# Patient Record
Sex: Female | Born: 1970 | Race: White | Hispanic: No | Marital: Single | Smoking: Never smoker
Health system: Southern US, Community
[De-identification: ages and names within clinical notes are randomized; demographics above are authoritative.]

## PROBLEM LIST (undated history)

## (undated) HISTORY — PX: BRAIN SURGERY: SHX531

---

## 2017-05-05 DIAGNOSIS — S069X9A Unspecified intracranial injury with loss of consciousness of unspecified duration, initial encounter: Secondary | ICD-10-CM

## 2017-05-05 DIAGNOSIS — S069XAA Unspecified intracranial injury with loss of consciousness status unknown, initial encounter: Secondary | ICD-10-CM

## 2017-05-05 HISTORY — DX: Unspecified intracranial injury with loss of consciousness of unspecified duration, initial encounter: S06.9X9A

## 2017-05-05 HISTORY — DX: Unspecified intracranial injury with loss of consciousness status unknown, initial encounter: S06.9XAA

## 2019-07-20 ENCOUNTER — Emergency Department
Admission: EM | Admit: 2019-07-20 | Discharge: 2019-07-20 | Disposition: A | Discharge status: Home / Self Care | Payer: Self-pay | Source: Home / Self Care | Attending: Family Medicine | Admitting: Family Medicine

## 2019-07-20 ENCOUNTER — Other Ambulatory Visit: Payer: Self-pay

## 2019-07-20 DIAGNOSIS — R1032 Left lower quadrant pain: Secondary | ICD-10-CM | POA: Diagnosis not present

## 2019-07-20 DIAGNOSIS — Z8542 Personal history of malignant neoplasm of other parts of uterus: Secondary | ICD-10-CM

## 2019-07-20 DIAGNOSIS — Q6102 Congenital multiple renal cysts: Secondary | ICD-10-CM

## 2019-07-20 DIAGNOSIS — R109 Unspecified abdominal pain: Secondary | ICD-10-CM

## 2019-07-20 LAB — POCT URINALYSIS DIP (MANUAL ENTRY)
Bilirubin, UA: NEGATIVE
Blood, UA: NEGATIVE
Glucose, UA: NEGATIVE mg/dL
Ketones, POC UA: NEGATIVE mg/dL
Leukocytes, UA: NEGATIVE
Nitrite, UA: NEGATIVE
Spec Grav, UA: 1.015 (ref 1.010–1.025)
Urobilinogen, UA: 1 E.U./dL
pH, UA: 8.5 — AB (ref 5.0–8.0)

## 2019-07-20 MED ORDER — TRAMADOL HCL 50 MG PO TABS: 50.0000 mg | ORAL_TABLET | Freq: Four times a day (QID) | ORAL | 0 refills | Status: AC | PRN

## 2019-07-20 NOTE — ED Notes (Signed)
Abdomen Pelvis CT scan scheduled for tomorrow, 07/21/2019 at 10:00 a.m. Patient will pick up the required oral contrast today prior to leaving the facility. Patient verbalizes understanding.

## 2019-07-20 NOTE — ED Triage Notes (Signed)
Patient presents to Urgent Care with complaints of right flank pain and right groin pain since yesterday. Patient reports when she urinates, she has a pain in her "kidney" as well, pt states she has multiple cysts in her kidneys and is not sure if possibly one of them ruptured or of she has a kidney stone.

## 2019-07-20 NOTE — ED Provider Notes (Signed)
[facility]    CSN: [id number] Arrival date & time: 07/20/19  0957      History   Chief Complaint Chief Complaint  Patient presents with  . Flank Pain    HPI Carla Wolf is a 49 y.o. female.   Last night while sitting in a chair, patient arose and felt sudden sharp stabbing pain in her left flank that lasted at least a minute.  She continued to have a low level of pain in her left flank radiating to her left lower abdomen and groin.  She developed distinct pain in her left groin area during urination without dysuria, urgency, frequency, or hematuria.  Her pain is worse when walking and standing erect.  She denies fevers, chills, and sweats.  Last night she was awakened twice by recurrent pain, and she had one episode of nausea/vomiting.  She also notes having had chills last night during episodes of pain, but no fever. She has a past surgical history of endometrial cancer and hysterectomy (her right ovary remains in place) age 73.  She has a history of cystic kidneys.  She reports that she had a ruptured kidney cyst in the past.  She has also had an appendectomy in the past.  She denies history of kidney stones  The history is provided by the patient.  Flank Pain This is a new problem. The current episode started yesterday. The problem occurs constantly. The problem has been gradually improving. Associated symptoms include abdominal pain. The symptoms are aggravated by walking. Nothing relieves the symptoms. Treatments tried: Ibuprofen. The treatment provided no relief.    Past Medical History:  Diagnosis Date  . TBI (traumatic brain injury) (HCC) 2019    Active problems: Trigeminal neuralgia of right side of face 05/11/2018  History of concussion 03/24/2018  Adjustment disorder with mixed anxiety and depressed mood 03/24/2018  Cognitive change 03/24/2018  Chronic right shoulder pain 11/20/2014  H/O anaphylactic shock 08/03/2013  Attention deficit disorder       Surgical History HYSTERECTOMY 05/05/2002 - 05/05/2003     KNEE ARTHROSCOPY 05/05/1998 - 05/05/1999 Left    MANDIBLE FRACTURE SURGERY 05/06/1999 - 05/04/2000     NEUROFIBROMA EXCISION 05/05/2000 - 05/04/2001 Right right foot   TONSILLECTOMY 05/05/1998 - 05/05/1999     CHOLECYSTECTOMY 05/06/1999 - 05/04/2000     APPENDECTOMY 05/06/1987 - 05/04/1988     CRANIOTOMY MVD (MICRO VASCULAR DECOMPRESSION) 05/26/2018 Head/Right Procedure: CRANIOTOMY MVD (MICRO VASCULAR DECOMPRESSION); Surgeon: Whitney Post, MD; Location: [facility] MAIN OR; Service: Neurosurgery; Laterality: Right;  Medical devices from this surgery are in the Implants section.      Obstetrics History Grav Para Term Pre Abrt (TAB) (SAB) (Ect) Mult Lvng Comments  4 4 4               Home Medications    Prior to Admission medications   Medication Sig Start Date End Date Taking? Authorizing Provider  gabapentin (NEURONTIN) 100 MG capsule Take 100 mg by mouth 2 (two) times daily.   Yes [provider]  methylphenidate 54 MG PO CR tablet Take 54 mg by mouth every morning.   Yes [provider]  carbamazepine (TEGRETOL XR) 100 MG 12 hr tablet Take 100 mg by mouth 2 (two) times daily. 06/09/19   [provider]  donepezil (ARICEPT) 5 MG tablet Take 5 mg by mouth daily. 07/08/19   [provider]  pantoprazole (PROTONIX) 40 MG tablet Take 40 mg by mouth daily. 06/09/19   [provider]  rizatriptan (MAXALT) 10 MG tablet  03/24/19   [provider]  traMADol (ULTRAM) 50 MG tablet Take 1 tablet (50 mg total) by mouth every 6 (six) hours as needed for moderate pain or severe pain. 07/20/19   Lattie Haw, MD    Family History Family History  Adopted: Yes  Problem Relation Age of Onset  . Heart attack Mother   . Drug abuse Mother   . COPD Father   . Hypertension Father   . Heart failure Father     Social History Social History   Tobacco Use  . Smoking  status: Never Smoker  . Smokeless tobacco: Never Used  Substance Use Topics  . Alcohol use: Not Currently  . Drug use: Not on file     Allergies   Contrast media  [iodinated diagnostic agents], Iodides, Other, Sumatriptan, Bacitracin, Codeine, Polyethylene glycol, Prochlorperazine, Promethazine, Sulfa antibiotics, and Adhesive  [tape]   Review of Systems Review of Systems  Constitutional: Positive for activity change and chills. Negative for appetite change, diaphoresis, fatigue and fever.  HENT: Negative.   Eyes: Negative.   Respiratory: Negative.   Cardiovascular: Negative.   Gastrointestinal: Positive for abdominal pain, nausea and vomiting. Negative for blood in stool, constipation and diarrhea.  Genitourinary: Positive for flank pain. Negative for decreased urine volume, difficulty urinating, dysuria, frequency, hematuria, pelvic pain, urgency, vaginal bleeding, vaginal discharge and vaginal pain.  Skin: Negative.   Neurological: Negative.      Physical Exam Triage Vital Signs ED Triage Vitals  Enc Vitals Group     BP 07/20/19 1017 134/82     Pulse Rate 07/20/19 1017 69     Resp 07/20/19 1017 16     Temp 07/20/19 1017 98.1 F (36.7 C)     Temp Source 07/20/19 1017 Oral     SpO2 07/20/19 1017 97 %     Weight --      Height --      Head Circumference --      Peak Flow --      Pain Score 07/20/19 1011 8     Pain Loc --      Pain Edu? --      Excl. in GC? --    No data found.  Updated Vital Signs BP 134/82 (BP Location: Right Arm)   Pulse 69   Temp 98.1 F (36.7 C) (Oral)   Resp 16   SpO2 97%   Visual Acuity Right Eye Distance:   Left Eye Distance:   Bilateral Distance:    Right Eye Near:   Left Eye Near:    Bilateral Near:     Physical Exam Constitutional:      General: She is not in acute distress.    Appearance: She is obese.  HENT:     Head: Atraumatic.     Right Ear: External ear normal.     Left Ear: External ear normal.     Nose: Nose  normal.     Mouth/Throat:     Mouth: Mucous membranes are moist.  Eyes:     Conjunctiva/sclera: Conjunctivae normal.     Pupils: Pupils are equal, round, and reactive to light.  Cardiovascular:     Rate and Rhythm: Regular rhythm.     Heart sounds: Normal heart sounds.  Pulmonary:     Breath sounds: Normal breath sounds.  Abdominal:     General: Abdomen is protuberant. Bowel sounds are normal. There is no distension.     Palpations:  Abdomen is soft. There is no hepatomegaly, splenomegaly or mass.     Tenderness: There is abdominal tenderness in the left lower quadrant. There is left CVA tenderness. There is no right CVA tenderness or guarding.    Musculoskeletal:     Right lower leg: No edema.     Left lower leg: No edema.  Lymphadenopathy:     Cervical: No cervical adenopathy.  Skin:    General: Skin is warm and dry.     Findings: No rash.  Neurological:     Mental Status: She is alert.      UC Treatments / Results  Labs (all labs ordered are listed, but only abnormal results are displayed) Labs Reviewed  BASIC METABOLIC PANEL - Abnormal; Notable for the following components:      Result Value   Glucose, Bld 102 (*)    All other components within normal limits  POCT URINALYSIS DIP (MANUAL ENTRY) - Abnormal; Notable for the following components:   pH, UA 8.5 (*)    Protein Ur, POC trace (*)    All other components within normal limits    EKG   Radiology No results found.  Procedures Procedures (including critical care time)  Medications Ordered in UC Medications - No data to display  Initial Impression / Assessment and Plan / UC Course  I have reviewed the triage vital signs and the nursing notes.  Pertinent labs & imaging results that were available during my care of the patient were reviewed by me and considered in my medical decision making (see chart for details).    Flank and abdominal pain ?etiology.  Note unremarkable urinalysis.   Note  significant past history of endometrial CA and cystic kidneys.  Will schedule CT scan abdomen w/oral contrast tomorrow (note history of allergy to iodinated contrast agents). Rx for tramadol (#10, no refill); patient notes that she has had no adverse effects in the past.   Final Clinical Impressions(s) / UC Diagnoses   Final diagnoses:  Left lower quadrant abdominal pain  Left flank pain  Multiple renal cysts  History of endometrial cancer     Discharge Instructions     If symptoms become significantly worse during the night or over the weekend, proceed to the local emergency room.     ED Prescriptions    Medication Sig Dispense Auth. Provider   traMADol (ULTRAM) 50 MG tablet Take 1 tablet (50 mg total) by mouth every 6 (six) hours as needed for moderate pain or severe pain. 10 tablet Kandra Nicolas, MD        Kandra Nicolas, MD 07/21/19 [phone]

## 2019-07-20 NOTE — Discharge Instructions (Signed)
If symptoms become significantly worse during the night or over the weekend, proceed to the local emergency room.  

## 2019-07-21 ENCOUNTER — Other Ambulatory Visit: Payer: BC Managed Care – PPO

## 2019-07-21 ENCOUNTER — Emergency Department (INDEPENDENT_AMBULATORY_CARE_PROVIDER_SITE_OTHER)
Admission: EM | Admit: 2019-07-21 | Discharge: 2019-07-21 | Disposition: A | Discharge status: Home / Self Care | Payer: BC Managed Care – PPO | Source: Home / Self Care

## 2019-07-21 ENCOUNTER — Emergency Department (INDEPENDENT_AMBULATORY_CARE_PROVIDER_SITE_OTHER): Payer: BC Managed Care – PPO

## 2019-07-21 DIAGNOSIS — K5732 Diverticulitis of large intestine without perforation or abscess without bleeding: Secondary | ICD-10-CM

## 2019-07-21 DIAGNOSIS — N2889 Other specified disorders of kidney and ureter: Secondary | ICD-10-CM | POA: Diagnosis not present

## 2019-07-21 DIAGNOSIS — N281 Cyst of kidney, acquired: Secondary | ICD-10-CM

## 2019-07-21 LAB — BASIC METABOLIC PANEL
BUN: 9 mg/dL (ref 7–25)
CO2: 28 mmol/L (ref 20–32)
Calcium: 9.3 mg/dL (ref 8.6–10.4)
Chloride: 103 mmol/L (ref 98–110)
Creat: 0.85 mg/dL (ref 0.50–1.05)
Glucose, Bld: 102 mg/dL — ABNORMAL HIGH (ref 65–99)
Potassium: 4.1 mmol/L (ref 3.5–5.3)
Sodium: 140 mmol/L (ref 135–146)

## 2019-07-21 MED ORDER — AMOXICILLIN-POT CLAVULANATE 875-125 MG PO TABS: ORAL_TABLET | ORAL | 0 refills | Status: AC

## 2019-07-21 NOTE — ED Triage Notes (Addendum)
Patient placed in UC exam room to wait for CT results and to speak with the physician.  Patient was seen yesterday, should not have been arrived to receive CT scan results. No charge should be filed.

## 2019-07-21 NOTE — Discharge Instructions (Addendum)
Begin clear liquids for about 24 hours, then may begin a BRAT diet (Bananas, Rice, Applesauce, Toast) when abdominal pain improved. Then gradually advance to a regular diet as tolerated.   If symptoms become significantly worse during the night or over the weekend, proceed to the local emergency room.  

## 2019-07-21 NOTE — ED Provider Notes (Signed)
[facility]    CSN: [id number] Arrival date & time: 07/21/19  0948      History   Chief Complaint Chief Complaint  Patient presents with  . CT Results    HPI Carla Wolf is a 49 y.o. female.   Patient returns to discuss her CT abdomen/pelvis results.  She states that she still has left abdominal pain, not improved.  The history is provided by the patient.    Past Medical History:  Diagnosis Date  . TBI (traumatic brain injury) (HCC) 2019    There are no problems to display for this patient.   Past Surgical History:  Procedure Laterality Date  . BRAIN SURGERY      OB History   No obstetric history on file.      Home Medications    Prior to Admission medications   Medication Sig Start Date End Date Taking? Authorizing Provider  amoxicillin-clavulanate (AUGMENTIN) 875-125 MG tablet Take one tab PO Q8hr. Take with food 07/21/19   Lattie Haw, MD  carbamazepine (TEGRETOL XR) 100 MG 12 hr tablet Take 100 mg by mouth 2 (two) times daily. 06/09/19   [provider]  donepezil (ARICEPT) 5 MG tablet Take 5 mg by mouth daily. 07/08/19   [provider]  gabapentin (NEURONTIN) 100 MG capsule Take 100 mg by mouth 2 (two) times daily.    [provider]  methylphenidate 54 MG PO CR tablet Take 54 mg by mouth every morning.    [provider]  pantoprazole (PROTONIX) 40 MG tablet Take 40 mg by mouth daily. 06/09/19   [provider]  rizatriptan (MAXALT) 10 MG tablet  03/24/19   [provider]  traMADol (ULTRAM) 50 MG tablet Take 1 tablet (50 mg total) by mouth every 6 (six) hours as needed for moderate pain or severe pain. 07/20/19   Lattie Haw, MD    Family History Family History  Adopted: Yes  Problem Relation Age of Onset  . Heart attack Mother   . Drug abuse Mother   . COPD Father   . Hypertension Father   . Heart failure Father     Social History Social History   Tobacco Use  .  Smoking status: Never Smoker  . Smokeless tobacco: Never Used  Substance Use Topics  . Alcohol use: Not Currently  . Drug use: Not on file     Allergies   Contrast media  [iodinated diagnostic agents], Iodides, Other, Sumatriptan, Bacitracin, Codeine, Polyethylene glycol, Prochlorperazine, Promethazine, Sulfa antibiotics, and Adhesive  [tape]   Review of Systems Review of Systems  Constitutional: Positive for activity change and fatigue. Negative for appetite change, chills, diaphoresis and fever.  Gastrointestinal: Positive for abdominal pain. Negative for abdominal distention.     Physical Exam Triage Vital Signs ED Triage Vitals  Enc Vitals Group     BP 07/21/19 0959 121/79     Pulse Rate 07/21/19 0959 63     Resp --      Temp 07/21/19 0959 98 F (36.7 C)     Temp Source 07/21/19 0959 Oral     SpO2 07/21/19 0959 98 %     Weight --      Height --      Head Circumference --      Peak Flow --      Pain Score 07/21/19 1001 (P) 8     Pain Loc --      Pain Edu? --  Excl. in GC? --    No data found.  Updated Vital Signs BP 121/79 (BP Location: Right Arm)   Pulse 63   Temp 98 F (36.7 C) (Oral)   SpO2 98%   Visual Acuity Right Eye Distance:   Left Eye Distance:   Bilateral Distance:    Right Eye Near:   Left Eye Near:    Bilateral Near:     Physical Exam Vitals and nursing note reviewed.  Constitutional:      General: She is not in acute distress. Neurological:     Mental Status: She is alert.   Patient not examined otherwise   UC Treatments / Results  Labs (all labs ordered are listed, but only abnormal results are displayed) Labs Reviewed - No data to display  EKG   Radiology CT ABDOMEN PELVIS WO CONTRAST  Result Date: 07/21/2019 CLINICAL DATA:  Left lower abdominal pain EXAM: CT ABDOMEN AND PELVIS WITHOUT CONTRAST TECHNIQUE: Multidetector CT imaging of the abdomen and pelvis was performed following the standard protocol without IV  contrast. Oral contrast was administered. COMPARISON:  None. FINDINGS: Lower chest: Lung bases are clear. Hepatobiliary: No focal liver lesions are appreciable on this noncontrast enhanced study. Gallbladder is absent. There is no biliary duct dilatation. Pancreas: No pancreatic mass or inflammatory focus. Spleen: No splenic lesions are evident. Adrenals/Urinary Tract: Adrenals bilaterally appear normal. There is a cyst in the mid to lower right kidney measuring 2.2 x 1.9 cm. There are multiple cysts throughout the left kidney, largest measuring 6.4 x 5.0 cm. There is a mass arising medially from the upper pole of the left kidney measuring 1.1 x 0.8 cm as well as a mass arising from the lateral mid left kidney measuring 0.9 x 0.8 cm which have attenuation values higher than is expected with a simple cyst. There are occasional calcifications along cyst walls. There is no hydronephrosis on either side. There is no evident renal calculus not associated with a cyst. There is no ureteral calculus on either side. Urinary bladder is midline with wall thickness within normal limits. Stomach/Bowel: There is localized wall thickening in the mid to distal sigmoid colon with irregular diverticula in this area. There is mild adjacent mesenteric stranding consistent with localized diverticulitis. There is no evident abscess or perforation in this area. No other bowel inflammation evident. There is no bowel obstruction. Terminal ileum appears normal. No free air or portal venous air evident. Vascular/Lymphatic: There is no evident abdominal aortic aneurysm. No vascular lesions are appreciable on this noncontrast enhanced study. There is no evident adenopathy in the abdomen or pelvis. Reproductive: Uterus absent.  No evident pelvic mass. Other: No appendiceal region inflammation. No abscess or ascites in the abdomen or pelvis. There is mild fat in the umbilicus. Musculoskeletal: No blastic or lytic bone lesions. No intramuscular  lesions are evident. IMPRESSION: 1. Focal diverticulitis at the mid to distal sigmoid colon without appreciable perforation or abscess. Irregular diverticula noted in this area with wall thickening and surrounding mesenteric thickening. 2. Multiple cystic lesions throughout the left kidney with a single cyst on the right. Question adult polycystic kidney disease. Several cysts have mild calcification in their walls, a common finding with adult polycystic kidney disease. There are 2 small lesions in the left kidney which cannot be classified as cysts. Further evaluation with pre and post contrast MRI should be considered. Pre and post contrast CT could alternatively be performed, but would likely be of decreased accuracy given lesion size. 3. No  bowel obstruction. No abscess in the abdomen or pelvis. No periappendiceal region inflammation. 4.  Absent gallbladder. These results will be called to the ordering clinician or representative by the Radiologist Assistant, and communication documented in the PACS or [employer]. Electronically Signed   By: Bretta Bang III M.D.   On: 07/21/2019 10:10    Procedures Procedures (including critical care time)  Medications Ordered in UC Medications - No data to display  Initial Impression / Assessment and Plan / UC Course  I have reviewed the triage vital signs and the nursing notes.  Pertinent labs & imaging results that were available during my care of the patient were reviewed by me and considered in my medical decision making (see chart for details).    Begin Augmentin and clear liguid diet. Followup with PCP in 5 days as scheduled; will need further evaluation of left renal masses. Recommend follow-up with GI when present diverticulitis resolves.    Final Clinical Impressions(s) / UC Diagnoses   Final diagnoses:  Diverticulitis of colon  Left renal mass     Discharge Instructions     Begin clear liquids for about 24 hours, then may  begin a BRAT diet (Bananas, Rice, Applesauce, Toast) when abdominal pain improved. Then gradually advance to a regular diet as tolerated.    If symptoms become significantly worse during the night or over the weekend, proceed to the local emergency room.      ED Prescriptions    Medication Sig Dispense Auth. Provider   amoxicillin-clavulanate (AUGMENTIN) 875-125 MG tablet Take one tab PO Q8hr. Take with food 30 tablet Lattie Haw, MD        Lattie Haw, MD 07/21/19 1410

## 2020-11-22 IMAGING — CT CT ABD-PELV W/O CM
2 of 4 series · 15 of 46 positions shown, 17 images · non-contrast
Comparison: None.

CLINICAL DATA: Left lower abdominal pain

EXAM:
CT ABDOMEN AND PELVIS WITHOUT CONTRAST
TECHNIQUE: Multidetector CT imaging of the abdomen and pelvis was performed
following the standard protocol without IV contrast. Oral contrast
was administered.

[Series 2: axial st · axial · 0.92mm/px · z∈[-659,-179]mm · 12 of 110 slices shown, 14 images]
[im 9/110  soft-tissue]
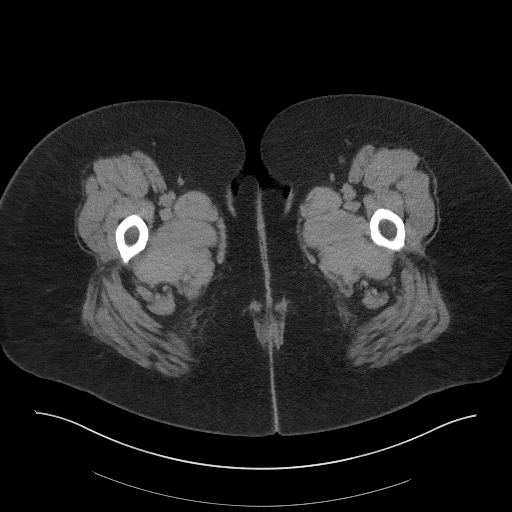
[im 9/110  bone]
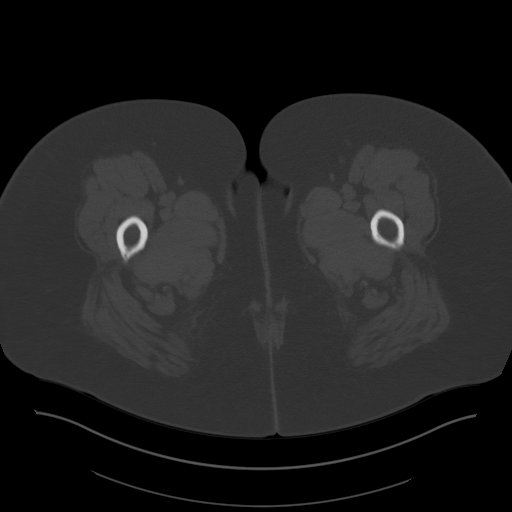
[im 18/110  soft-tissue]
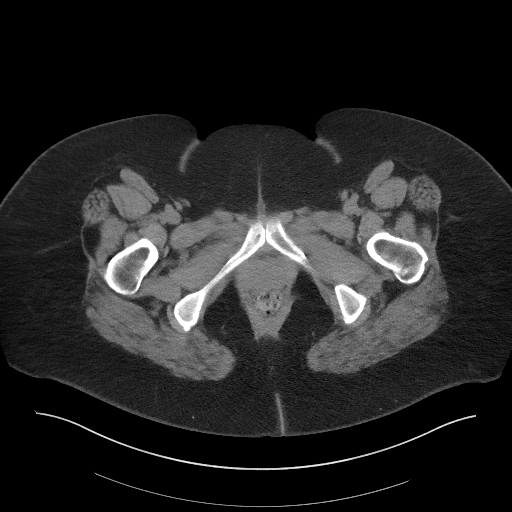
[im 27/110  soft-tissue]
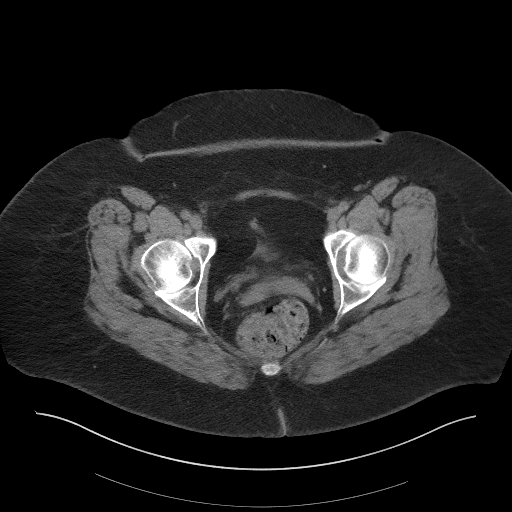
[im 35/110  soft-tissue]
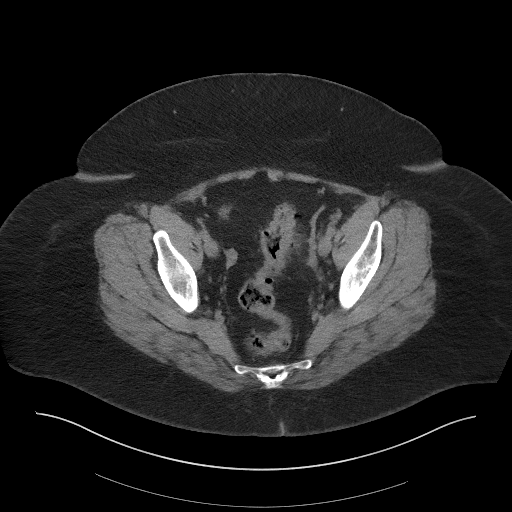
[im 44/110  soft-tissue]
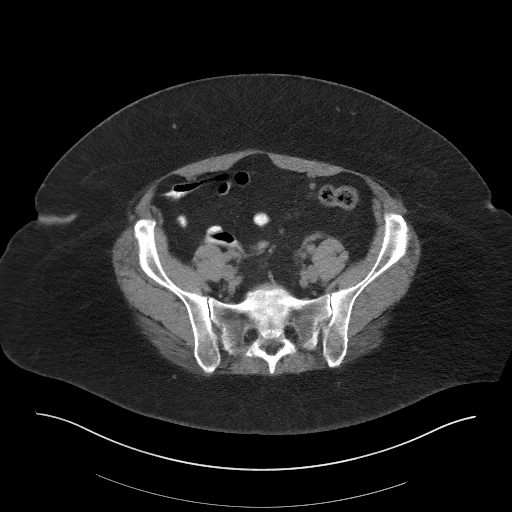
[im 53/110  soft-tissue]
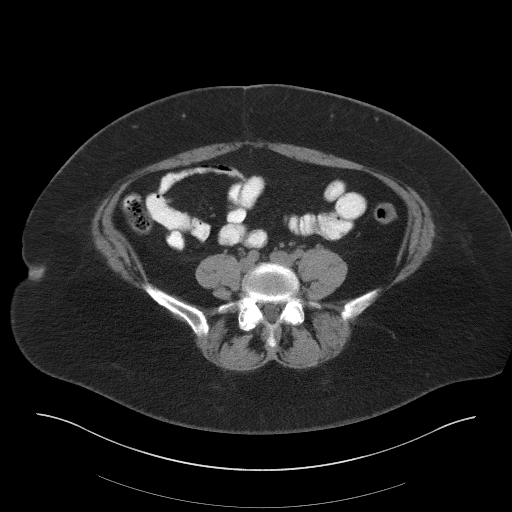
[im 62/110  soft-tissue]
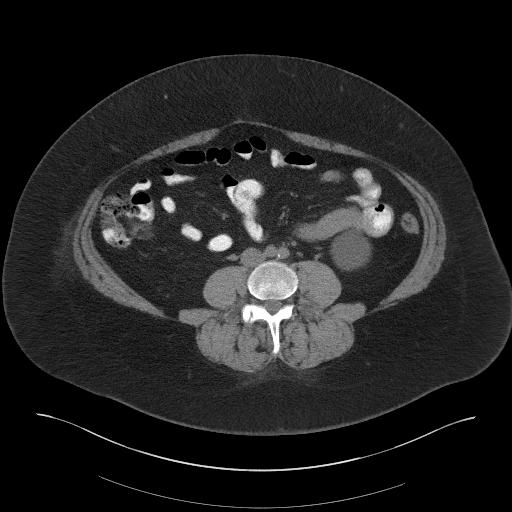
[im 70/110  soft-tissue]
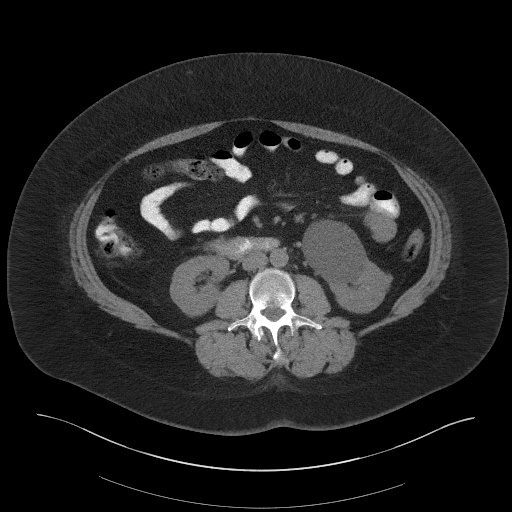
[im 79/110  soft-tissue]
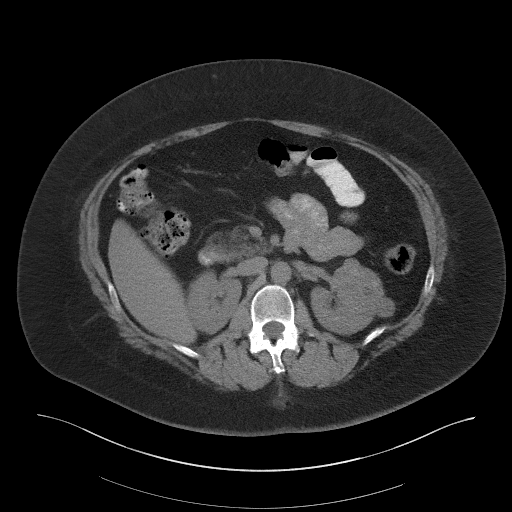
[im 79/110  bone]
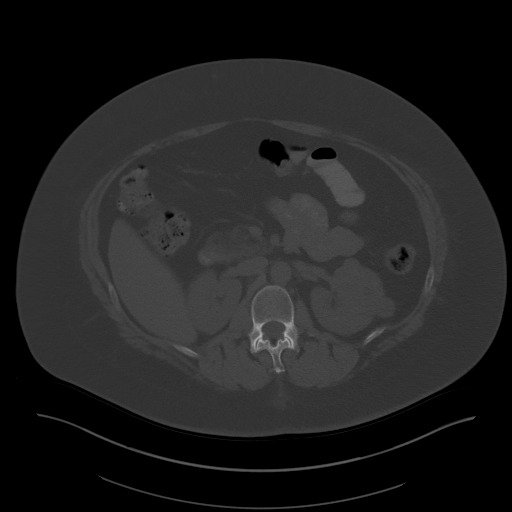
[im 88/110  soft-tissue]
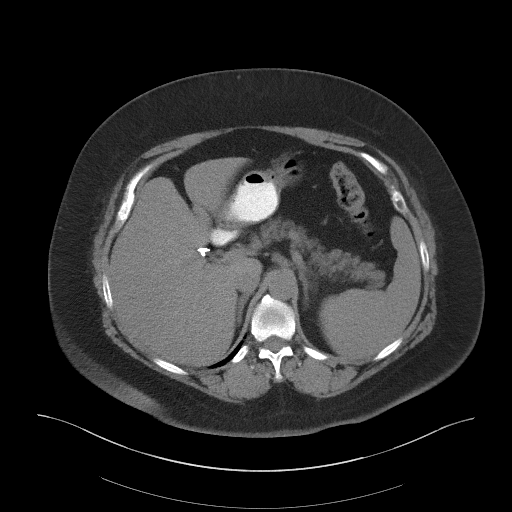
[im 96/110  soft-tissue]
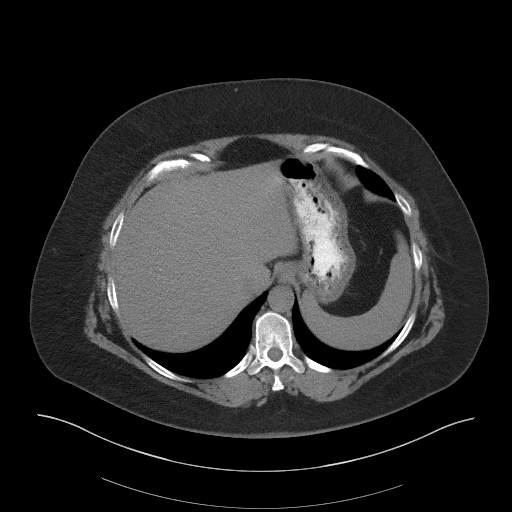
[im 105/110  soft-tissue]
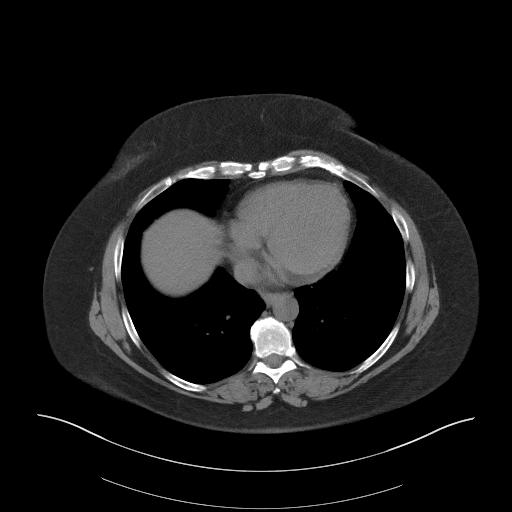

[Series 4: coronal st · coronal · 1.00mm/px · 3 of 113 slices shown]
[im 38/113  soft-tissue]
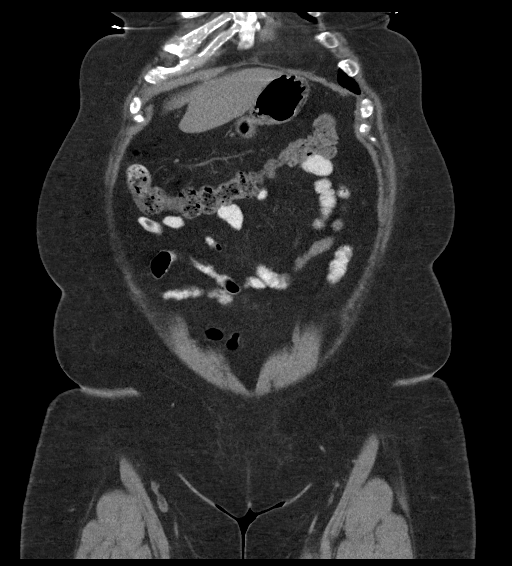
[im 50/113  soft-tissue]
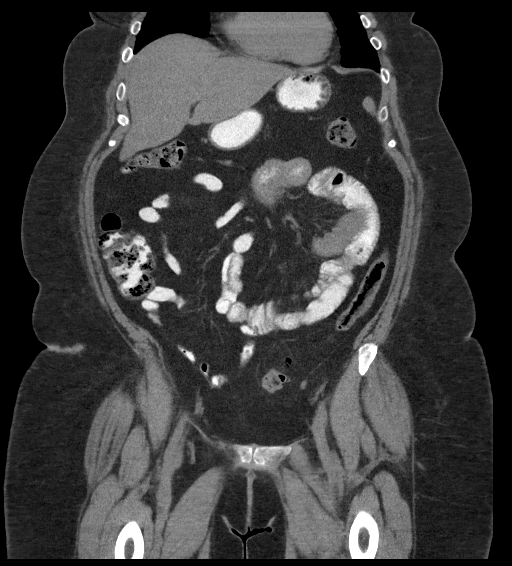
[im 63/113  soft-tissue]
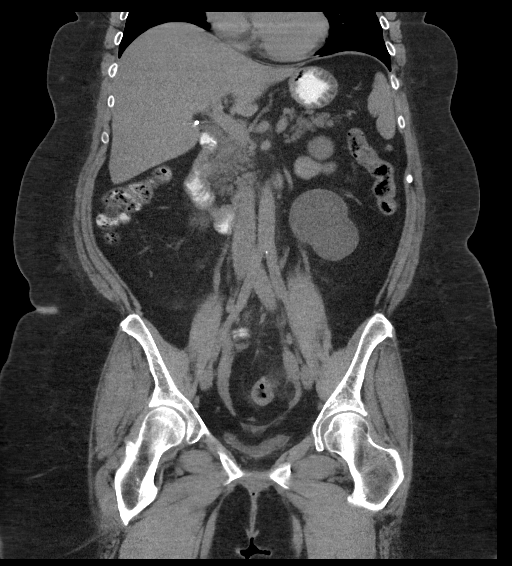

[15 of 46 positions shown; findings below may reference images not displayed]

FINDINGS: Lower chest: Lung bases are clear.

Hepatobiliary: No focal liver lesions are appreciable on this
noncontrast enhanced study. Gallbladder is absent. There is no
biliary duct dilatation.

Pancreas: No pancreatic mass or inflammatory focus.

Spleen: No splenic lesions are evident.

Adrenals/Urinary Tract: Adrenals bilaterally appear normal. There is
a cyst in the mid to lower right kidney measuring 2.2 x 1.9 cm.
There are multiple cysts throughout the left kidney, largest
measuring 6.4 x 5.0 cm. There is a mass arising medially from the
upper pole of the left kidney measuring 1.1 x 0.8 cm as well as a
mass arising from the lateral mid left kidney measuring 0.9 x 0.8 cm
which have attenuation values higher than is expected with a simple
cyst. There are occasional calcifications along cyst walls. There is
no hydronephrosis on either side. There is no evident renal calculus
not associated with a cyst. There is no ureteral calculus on either
side. Urinary bladder is midline with wall thickness within normal
limits.

Stomach/Bowel: There is localized wall thickening in the mid to
distal sigmoid colon with irregular diverticula in this area. There
is mild adjacent mesenteric stranding consistent with localized
diverticulitis. There is no evident abscess or perforation in this
area. No other bowel inflammation evident. There is no bowel
obstruction. Terminal ileum appears normal. No free air or portal
venous air evident.

Vascular/Lymphatic: There is no evident abdominal aortic aneurysm.
No vascular lesions are appreciable on this noncontrast enhanced
study. There is no evident adenopathy in the abdomen or pelvis.

Reproductive: Uterus absent.  No evident pelvic mass.

Other: No appendiceal region inflammation. No abscess or ascites in
the abdomen or pelvis. There is mild fat in the umbilicus.

Musculoskeletal: No blastic or lytic bone lesions. No intramuscular
lesions are evident.
IMPRESSION: 1. Focal diverticulitis at the mid to distal sigmoid colon without
appreciable perforation or abscess. Irregular diverticula noted in
this area with wall thickening and surrounding mesenteric
thickening.

2. Multiple cystic lesions throughout the left kidney with a single
cyst on the right. Question adult polycystic kidney disease. Several
cysts have mild calcification in their walls, a common finding with
adult polycystic kidney disease. There are 2 small lesions in the
left kidney which cannot be classified as cysts. Further evaluation
with pre and post contrast MRI should be considered. Pre and post
contrast CT could alternatively be performed, but would likely be of
decreased accuracy given lesion size.

3. No bowel obstruction. No abscess in the abdomen or pelvis. No
periappendiceal region inflammation.

4.  Absent gallbladder.

These results will be called to the ordering clinician or
representative by the Radiologist Assistant, and communication
documented in the PACS or [REDACTED].
